# Patient Record
Sex: Male | Born: 1995 | Race: Black or African American | Hispanic: No | Marital: Single | Smoking: Current some day smoker
Health system: Southern US, Community
[De-identification: ages and names within clinical notes are randomized; demographics above are authoritative.]

---
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
## 2020-02-13 ENCOUNTER — Other Ambulatory Visit: Payer: Self-pay

## 2020-02-13 ENCOUNTER — Encounter: Payer: Self-pay | Admitting: Physician Assistant

## 2020-02-13 ENCOUNTER — Ambulatory Visit: Payer: Self-pay | Admitting: Physician Assistant

## 2020-02-13 DIAGNOSIS — Z113 Encounter for screening for infections with a predominantly sexual mode of transmission: Secondary | ICD-10-CM

## 2020-02-13 DIAGNOSIS — Z720 Tobacco use: Secondary | ICD-10-CM | POA: Insufficient documentation

## 2020-02-13 LAB — GRAM STAIN

## 2020-02-13 NOTE — Progress Notes (Signed)
Here today for STD screening. Declines bloodwork. Wendell Fiebig, RN ? ?

## 2020-02-13 NOTE — Progress Notes (Signed)
   [facility] Department STI clinic/screening visit  Subjective:  Joe Nichols is a 24 y.o. male being seen today for an STI screening visit. The patient reports they do not have symptoms.    Patient has the following medical conditions:  There are no problems to display for this patient.    Chief Complaint  Patient presents with  . SEXUALLY TRANSMITTED DISEASE    24 yo man here for STI screening. Asymptomatic today. Did notice some mild tingling at head of penis after voiding several days ago.   Patient reports see HPI.   See flowsheet for further details and programmatic requirements.    The following portions of the patient's history were reviewed and updated as appropriate: allergies, current medications, past medical history, past social history, past surgical history and problem list.  Objective:  There were no vitals filed for this visit.  Physical Exam Constitutional:      Appearance: Normal appearance. He is normal weight.  HENT:     Head: Normocephalic.     Mouth/Throat:     Mouth: Mucous membranes are moist.     Pharynx: Oropharynx is clear. No oropharyngeal exudate or posterior oropharyngeal erythema.  Pulmonary:     Effort: Pulmonary effort is normal.  Abdominal:     General: Abdomen is flat.     Palpations: Abdomen is soft.     Tenderness: There is no abdominal tenderness.  Genitourinary:    Penis: Normal and circumcised. No erythema, tenderness, discharge, swelling or lesions.      Testes: Normal.        Right: Mass, tenderness or swelling not present.        Left: Mass, tenderness or swelling not present.     Epididymis:     Right: Normal.     Left: Normal.  Musculoskeletal:        General: Normal range of motion.     Cervical back: Neck supple.  Lymphadenopathy:     Cervical: No cervical adenopathy.     Lower Body: No right inguinal adenopathy. No left inguinal adenopathy.  Skin:    General: Skin is warm and dry.   Comments: Tattoo mid chest  Neurological:     General: No focal deficit present.     Mental Status: He is alert and oriented to person, place, and time.  Psychiatric:        Mood and Affect: Mood normal.        Behavior: Behavior normal.        Thought Content: Thought content normal.        Judgment: Judgment normal.       Assessment and Plan:  Joe Nichols is a 23 y.o. male presenting to the [facility] Department for STI screening  There are no diagnoses linked to this encounter.    No follow-ups on file.  No future appointments.  Landry Dyke, PA-C

## 2020-02-13 NOTE — Progress Notes (Signed)
Gram Stain results reviewed. No treatment per standing orders indicated. Tawny Hopping, RN

## 2020-02-18 LAB — GONOCOCCUS CULTURE

## 2020-05-22 ENCOUNTER — Ambulatory Visit: Payer: Self-pay | Admitting: Physician Assistant

## 2020-05-22 ENCOUNTER — Other Ambulatory Visit: Payer: Self-pay

## 2020-05-22 ENCOUNTER — Encounter: Payer: Self-pay | Admitting: Physician Assistant

## 2020-05-22 DIAGNOSIS — Z299 Encounter for prophylactic measures, unspecified: Secondary | ICD-10-CM

## 2020-05-22 DIAGNOSIS — Z113 Encounter for screening for infections with a predominantly sexual mode of transmission: Secondary | ICD-10-CM

## 2020-05-22 LAB — GRAM STAIN

## 2020-05-22 MED ORDER — AZITHROMYCIN 500 MG PO TABS
1000.0000 mg | ORAL_TABLET | Freq: Once | ORAL | Status: AC
Start: 2020-05-22 — End: 2020-05-22
  Administered 2020-05-22: 1000 mg via ORAL

## 2020-05-22 NOTE — Progress Notes (Signed)
Pt here for STD screening. 

## 2020-05-22 NOTE — Progress Notes (Signed)
Gram stain reviewed with provider and is negative today. Pt received Azithromycin 1g po DOT per provider order. Counseled pt per provider orders and pt states understanding. Provider orders completed.

## 2020-05-23 NOTE — Progress Notes (Signed)
   [facility] Department STI clinic/screening visit  Subjective:  Joe Nichols is a 24 y.o. male being seen today for an STI screening visit. The patient reports they do have symptoms.    Patient has the following medical conditions:   Patient Active Problem List   Diagnosis Date Noted  . Tobacco abuse 02/13/2020     Chief Complaint  Patient presents with  . SEXUALLY TRANSMITTED DISEASE    screening    HPI  Patient reports that he has had dysuria off and on for 2 months.  Denies other symptoms or any associated symptoms, association with certain foods or drinks.  Denies chronic conditions and surgeries.  Per patient, last HIV test was about 1 year ago.    See flowsheet for further details and programmatic requirements.    The following portions of the patient's history were reviewed and updated as appropriate: allergies, current medications, past medical history, past social history, past surgical history and problem list.  Objective:  There were no vitals filed for this visit.  Physical Exam Constitutional:      General: He is not in acute distress.    Appearance: Normal appearance.  HENT:     Head: Normocephalic and atraumatic.     Comments: No nits, lice or hair loss. No cervical, supraclavicular or axillary adenopathy.    Mouth/Throat:     Mouth: Mucous membranes are moist.     Pharynx: Oropharynx is clear. No oropharyngeal exudate or posterior oropharyngeal erythema.  Eyes:     Conjunctiva/sclera: Conjunctivae normal.  Pulmonary:     Effort: Pulmonary effort is normal.  Abdominal:     Palpations: Abdomen is soft. There is no mass.     Tenderness: There is no abdominal tenderness. There is no guarding or rebound.  Genitourinary:    Penis: Normal.      Testes: Normal.     Comments: Pubic area without nits, lice, edema, erythema, lesions and inguinal adenopathy. Penis without rash, lesions and discharge at meatus. Musculoskeletal:     Cervical  back: Neck supple. No tenderness.  Skin:    General: Skin is warm and dry.     Findings: No bruising, erythema, lesion or rash.  Neurological:     Mental Status: He is alert and oriented to person, place, and time.  Psychiatric:        Mood and Affect: Mood normal.        Behavior: Behavior normal.        Thought Content: Thought content normal.        Judgment: Judgment normal.       Assessment and Plan:  Joe Nichols is a 24 y.o. male presenting to the [facility] Department for STI screening  1. Screening for STD (sexually transmitted disease) Patient into clinic with symptoms.  Declines blood work today. Rec condoms with all sex. Await test results.  Counseled that RN will call if needs to RTC for further treatment once results are back.  - Gram stain - Gonococcus culture  2. Prophylactic measure Will treat with Azithromycin 1 g po DOT today due to patient symptoms. Rec no sex for 7 days and enc to have partner screened. RTC for re-treatment if vomits < 2 hr after taking medicine. - azithromycin (ZITHROMAX) tablet 1,000 mg     No follow-ups on file.  No future appointments.  Matt Holmes, PA

## 2020-05-27 LAB — GONOCOCCUS CULTURE

## 2020-07-04 ENCOUNTER — Emergency Department
Admission: EM | Admit: 2020-07-04 | Discharge: 2020-07-04 | Disposition: A | Discharge status: Home / Self Care | Payer: Self-pay | Attending: Emergency Medicine | Admitting: Emergency Medicine

## 2020-07-04 ENCOUNTER — Other Ambulatory Visit: Payer: Self-pay

## 2020-07-04 ENCOUNTER — Emergency Department: Payer: Self-pay

## 2020-07-04 DIAGNOSIS — Y939 Activity, unspecified: Secondary | ICD-10-CM | POA: Insufficient documentation

## 2020-07-04 DIAGNOSIS — F17210 Nicotine dependence, cigarettes, uncomplicated: Secondary | ICD-10-CM | POA: Insufficient documentation

## 2020-07-04 DIAGNOSIS — Y999 Unspecified external cause status: Secondary | ICD-10-CM | POA: Insufficient documentation

## 2020-07-04 DIAGNOSIS — S61411A Laceration without foreign body of right hand, initial encounter: Secondary | ICD-10-CM | POA: Insufficient documentation

## 2020-07-04 DIAGNOSIS — Y929 Unspecified place or not applicable: Secondary | ICD-10-CM | POA: Insufficient documentation

## 2020-07-04 DIAGNOSIS — W260XXA Contact with knife, initial encounter: Secondary | ICD-10-CM | POA: Insufficient documentation

## 2020-07-04 LAB — BASIC METABOLIC PANEL
Anion gap: 9 (ref 5–15)
BUN: 8 mg/dL (ref 6–20)
CO2: 25 mmol/L (ref 22–32)
Calcium: 9.1 mg/dL (ref 8.9–10.3)
Chloride: 105 mmol/L (ref 98–111)
Creatinine, Ser: 1.09 mg/dL (ref 0.61–1.24)
GFR calc Af Amer: >60 mL/min (ref >60)
GFR calc non Af Amer: >60 mL/min (ref >60)
Glucose, Bld: 99 mg/dL (ref 70–99)
Potassium: 3.8 mmol/L (ref 3.5–5.1)
Sodium: 139 mmol/L (ref 135–145)

## 2020-07-04 LAB — CBC WITH DIFFERENTIAL/PLATELET
Abs Immature Granulocytes: 0.05 10*3/uL (ref 0.00–0.07)
Basophils Absolute: 0.1 10*3/uL (ref 0.0–0.1)
Basophils Relative: 1 %
Eosinophils Absolute: 0.3 10*3/uL (ref 0.0–0.5)
Eosinophils Relative: 2 %
HCT: 44.5 % (ref 39.0–52.0)
Hemoglobin: 16.4 g/dL (ref 13.0–17.0)
Immature Granulocytes: 0 %
Lymphocytes Relative: 40 %
Lymphs Abs: 5 10*3/uL — ABNORMAL HIGH (ref 0.7–4.0)
MCH: 31.1 pg (ref 26.0–34.0)
MCHC: 36.9 g/dL — ABNORMAL HIGH (ref 30.0–36.0)
MCV: 84.3 fL (ref 80.0–100.0)
Monocytes Absolute: 1.1 10*3/uL — ABNORMAL HIGH (ref 0.1–1.0)
Monocytes Relative: 9 %
Neutro Abs: 6.2 10*3/uL (ref 1.7–7.7)
Neutrophils Relative %: 48 %
Platelets: 278 10*3/uL (ref 150–400)
RBC: 5.28 MIL/uL (ref 4.22–5.81)
RDW: 14.1 % (ref 11.5–15.5)
WBC: 12.7 10*3/uL — ABNORMAL HIGH (ref 4.0–10.5)
nRBC: 0 % (ref 0.0–0.2)

## 2020-07-04 MED ORDER — LIDOCAINE-EPINEPHRINE 2 %-1:100000 IJ SOLN
20.0000 mL | Freq: Once | INTRAMUSCULAR | Status: AC
  Administered 2020-07-04: 20 mL
  Filled 2020-07-04: qty 1
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
## 2021-06-11 ENCOUNTER — Other Ambulatory Visit: Payer: Self-pay

## 2021-06-11 ENCOUNTER — Ambulatory Visit: Payer: Medicaid Other | Admitting: Physician Assistant

## 2021-06-11 DIAGNOSIS — Z113 Encounter for screening for infections with a predominantly sexual mode of transmission: Secondary | ICD-10-CM

## 2021-06-11 LAB — GRAM STAIN

## 2021-06-12 ENCOUNTER — Encounter: Payer: Self-pay | Admitting: Physician Assistant

## 2021-06-12 NOTE — Progress Notes (Signed)
   [facility] Department STI clinic/screening visit  Subjective:  Joe Nichols is a 25 y.o. male being seen today for an STI screening visit. The patient reports they do not have symptoms.    Patient has the following medical conditions:   Patient Active Problem List   Diagnosis Date Noted   Tobacco abuse 02/13/2020     Chief Complaint  Patient presents with   SEXUALLY TRANSMITTED DISEASE    screening    HPI  Patient reports that he is not having any symptoms but would like a screening today.  Denies chronic conditions, surgeries and regular medicines.  Reports last HIV test was 2020 and last void prior to sample collection for Gram stain was over 2 hr ago.    See flowsheet for further details and programmatic requirements.    The following portions of the patient's history were reviewed and updated as appropriate: allergies, current medications, past medical history, past social history, past surgical history and problem list.  Objective:  There were no vitals filed for this visit.  Physical Exam Constitutional:      General: He is not in acute distress.    Appearance: Normal appearance.  HENT:     Head: Normocephalic and atraumatic.     Comments: No nits,lice, or hair loss. No cervical, supraclavicular or axillary adenopathy.     Mouth/Throat:     Mouth: Mucous membranes are moist.     Pharynx: Oropharynx is clear. No oropharyngeal exudate or posterior oropharyngeal erythema.  Eyes:     Conjunctiva/sclera: Conjunctivae normal.  Pulmonary:     Effort: Pulmonary effort is normal.  Abdominal:     Palpations: Abdomen is soft. There is no mass.     Tenderness: There is no abdominal tenderness. There is no guarding or rebound.  Genitourinary:    Penis: Normal.      Testes: Normal.     Comments: Pubic area without nits, lice, hair loss, edema, erythema, lesions and inguinal adenopathy. Penis circumcised without rash, lesions and discharge at  meatus. Testicles descended bilaterally,nt, no masses or edema.  Musculoskeletal:     Cervical back: Neck supple. No tenderness.  Skin:    General: Skin is warm and dry.     Findings: No bruising, erythema, lesion or rash.  Neurological:     Mental Status: He is alert and oriented to person, place, and time.  Psychiatric:        Mood and Affect: Mood normal.        Behavior: Behavior normal.        Thought Content: Thought content normal.        Judgment: Judgment normal.      Assessment and Plan:  Joe Nichols is a 25 y.o. male presenting to the [facility] Department for STI screening  1. Screening for STD (sexually transmitted disease) Patient into clinic with symptoms. Patient declines blood work today.  Reviewed Gram stain with patient and that no treatment is indicated today. Rec condoms with all sex. Await test results.  Counseled that RN will call if needs to RTC for treatment once results are back.  - Gram stain - Gonococcus culture     No follow-ups on file.  No future appointments.  Matt Holmes, PA

## 2021-06-16 LAB — GONOCOCCUS CULTURE

## 2021-08-13 ENCOUNTER — Other Ambulatory Visit: Payer: Self-pay

## 2021-08-13 ENCOUNTER — Encounter: Payer: Self-pay | Admitting: Physician Assistant

## 2021-08-13 ENCOUNTER — Ambulatory Visit: Payer: Self-pay | Admitting: Physician Assistant

## 2021-08-13 DIAGNOSIS — Z113 Encounter for screening for infections with a predominantly sexual mode of transmission: Secondary | ICD-10-CM

## 2021-08-13 LAB — GRAM STAIN

## 2021-08-13 NOTE — Progress Notes (Signed)
[facility] Department STI clinic/screening visit  Subjective:  Joe Nichols is a 25 y.o. male being seen today for an STI screening visit. The patient reports they do not have symptoms.    Patient has the following medical conditions:   Patient Active Problem List   Diagnosis Date Noted   Tobacco abuse 02/13/2020     Chief Complaint  Patient presents with   SEXUALLY TRANSMITTED DISEASE    screening    HPI  Patient reports that he is not having symptoms but would like a screening. Denies chronic conditions, surgeries and regular medicines.  Reports last HIV test was about 1 year ago and last void prior to sample collection for Gram stain was about 2 hr ago.     Screening for MPX risk: Does the patient have an unexplained rash? No Is the patient MSM? No Does the patient endorse multiple sex partners or anonymous sex partners? No Did the patient have close or sexual contact with a person diagnosed with MPX? No Has the patient traveled outside the [place] where MPX is endemic? No Is there a high clinical suspicion for MPX-- evidenced by one of the following No  -Unlikely to be chickenpox  -Lymphadenopathy  -Rash that present in same phase of evolution on any given body part   See flowsheet for further details and programmatic requirements.    The following portions of the patient's history were reviewed and updated as appropriate: allergies, current medications, past medical history, past social history, past surgical history and problem list.  Objective:  There were no vitals filed for this visit.  Physical Exam Constitutional:      General: He is not in acute distress.    Appearance: Normal appearance.  HENT:     Head: Normocephalic and atraumatic.     Comments: No nits,lice, or hair loss. No cervical, supraclavicular or axillary adenopathy.     Mouth/Throat:     Mouth: Mucous membranes are moist.     Pharynx: Oropharynx is clear. No oropharyngeal  exudate or posterior oropharyngeal erythema.  Eyes:     Conjunctiva/sclera: Conjunctivae normal.  Pulmonary:     Effort: Pulmonary effort is normal.  Abdominal:     Palpations: Abdomen is soft. There is no mass.     Tenderness: There is no abdominal tenderness. There is no guarding or rebound.  Genitourinary:    Penis: Normal.      Testes: Normal.     Comments: Pubic area without nits, lice, hair loss, edema, erythema, lesions and inguinal adenopathy. Penis circumcised without rash, lesions and discharge at meatus. Testicles descended bilaterally,nt, no masses or edema.  Musculoskeletal:     Cervical back: Neck supple. No tenderness.  Skin:    General: Skin is warm and dry.     Findings: No bruising, erythema, lesion or rash.  Neurological:     Mental Status: He is alert and oriented to person, place, and time.  Psychiatric:        Mood and Affect: Mood normal.        Behavior: Behavior normal.        Thought Content: Thought content normal.        Judgment: Judgment normal.      Assessment and Plan:  Joe Nichols is a 25 y.o. male presenting to the [facility] Department for STI screening  1. Screening for STD (sexually transmitted disease) Patient into clinic without symptoms. Patient declines blood work today. Reviewed with patient Gram  stain results and that no treatment is indicated today. Rec condoms with all sex. Await test results.  Counseled that RN will call if needs to RTC for treatment once results are back.  - Gram stain - Gonococcus culture     No follow-ups on file.  No future appointments.  Matt Holmes, PA

## 2021-08-17 LAB — GONOCOCCUS CULTURE

## 2022-02-22 ENCOUNTER — Emergency Department
Admission: EM | Admit: 2022-02-22 | Discharge: 2022-02-22 | Disposition: A | Discharge status: Home / Self Care | Payer: Self-pay | Attending: Emergency Medicine | Admitting: Emergency Medicine

## 2022-02-22 ENCOUNTER — Other Ambulatory Visit: Payer: Self-pay

## 2022-02-22 ENCOUNTER — Encounter: Payer: Self-pay | Admitting: Emergency Medicine

## 2022-02-22 ENCOUNTER — Emergency Department: Payer: Self-pay

## 2022-02-22 DIAGNOSIS — S01112A Laceration without foreign body of left eyelid and periocular area, initial encounter: Secondary | ICD-10-CM | POA: Insufficient documentation

## 2022-02-22 DIAGNOSIS — S0083XA Contusion of other part of head, initial encounter: Secondary | ICD-10-CM

## 2022-02-22 DIAGNOSIS — S0512XA Contusion of eyeball and orbital tissues, left eye, initial encounter: Secondary | ICD-10-CM

## 2022-02-22 DIAGNOSIS — Z23 Encounter for immunization: Secondary | ICD-10-CM | POA: Insufficient documentation

## 2022-02-22 DIAGNOSIS — S0181XA Laceration without foreign body of other part of head, initial encounter: Secondary | ICD-10-CM

## 2022-02-22 MED ORDER — TETANUS-DIPHTH-ACELL PERTUSSIS 5-2.5-18.5 LF-MCG/0.5 IM SUSY
0.5000 mL | PREFILLED_SYRINGE | Freq: Once | INTRAMUSCULAR | Status: AC
  Administered 2022-02-22: 0.5 mL via INTRAMUSCULAR
  Filled 2022-02-22: qty 0.5

## 2022-02-22 MED ORDER — FLUORESCEIN SODIUM 1 MG OP STRP
1.0000 | ORAL_STRIP | Freq: Once | OPHTHALMIC | Status: AC
  Administered 2022-02-22: 1 via OPHTHALMIC
  Filled 2022-02-22: qty 1

## 2022-02-22 MED ORDER — CEPHALEXIN 500 MG PO CAPS: 500.0000 mg | ORAL_CAPSULE | Freq: Three times a day (TID) | ORAL | 0 refills | Status: DC

## 2022-02-22 MED ORDER — TETRACAINE HCL 0.5 % OP SOLN
2.0000 [drp] | Freq: Once | OPHTHALMIC | Status: AC
  Administered 2022-02-22: 2 [drp] via OPHTHALMIC
  Filled 2022-02-22: qty 4
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
## 2022-02-22 NOTE — ED Notes (Signed)
Pt provided discharge instructions and prescription information. Pt was given the opportunity to ask questions and questions were answered. Discharge signature not obtained in the setting of the COVID-19 pandemic in order to reduce high touch surfaces.  ° °
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
## 2022-02-22 NOTE — Discharge Instructions (Addendum)
Follow-up with [facility] for any visual changes or eye pain.  Return emergency department worsening.  Use the antibiotic as prescribed. ?Tylenol or ibuprofen for pain as needed ?
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
## 2022-06-28 ENCOUNTER — Ambulatory Visit: Payer: Self-pay | Admitting: Family Medicine

## 2022-06-28 ENCOUNTER — Encounter: Payer: Self-pay | Admitting: Family Medicine

## 2022-06-28 DIAGNOSIS — N341 Nonspecific urethritis: Secondary | ICD-10-CM

## 2022-06-28 DIAGNOSIS — Z113 Encounter for screening for infections with a predominantly sexual mode of transmission: Secondary | ICD-10-CM

## 2022-06-28 LAB — GRAM STAIN

## 2022-06-28 MED ORDER — DOXYCYCLINE HYCLATE 100 MG PO TABS: 100.0000 mg | ORAL_TABLET | Freq: Two times a day (BID) | ORAL | 0 refills | Status: AC

## 2022-06-28 NOTE — Progress Notes (Signed)
[facility] Department STI clinic/screening visit  Subjective:  Joe Nichols is a 26 y.o. male being seen today for an STI screening visit. The patient reports they do have symptoms.    Patient has the following medical conditions:   Patient Active Problem List   Diagnosis Date Noted   Tobacco abuse 02/13/2020     Chief Complaint  Patient presents with   SEXUALLY TRANSMITTED DISEASE    Screening burning when urinating     HPI  Patient reports he has noted dysuria x 1 week, no other symptoms.  States that he has had 3 partners x 2-3 months.  He states that partners deny symptoms or STD treatment.   Does the patient or their partner desires a pregnancy in the next year? No  Screening for MPX risk: Does the patient have an unexplained rash? No Is the patient MSM? No Does the patient endorse multiple sex partners or anonymous sex partners? No Did the patient have close or sexual contact with a person diagnosed with MPX? No Has the patient traveled outside the [place] where MPX is endemic? No Is there a high clinical suspicion for MPX-- evidenced by one of the following No  -Unlikely to be chickenpox  -Lymphadenopathy  -Rash that present in same phase of evolution on any given body part   See flowsheet for further details and programmatic requirements.   Immunization History  Administered Date(s) Administered   Tdap 02/22/2022     The following portions of the patient's history were reviewed and updated as appropriate: allergies, current medications, past medical history, past social history, past surgical history and problem list.  Objective:  There were no vitals filed for this visit.  Physical Exam Constitutional:      Appearance: Normal appearance.  HENT:     Head: Normocephalic and atraumatic.     Comments: No nits or hair loss    Mouth/Throat:     Mouth: Mucous membranes are moist.     Pharynx: Oropharynx is clear. No oropharyngeal exudate or  posterior oropharyngeal erythema.  Pulmonary:     Effort: Pulmonary effort is normal.  Abdominal:     General: Abdomen is flat.     Palpations: Abdomen is soft. There is no hepatomegaly or mass.     Tenderness: There is no abdominal tenderness.  Genitourinary:    Pubic Area: No rash or pubic lice (no nits).      Penis: Circumcised. Discharge present. No tenderness, swelling or lesions.      Testes: Normal.     Epididymis:     Right: Normal.     Left: Normal.     Comments: Penis- small amt of clear discharge. Lymphadenopathy:     Head:     Right side of head: No preauricular or posterior auricular adenopathy.     Left side of head: No preauricular or posterior auricular adenopathy.     Cervical: No cervical adenopathy.     Upper Body:     Right upper body: No supraclavicular, axillary or epitrochlear adenopathy.     Left upper body: No supraclavicular, axillary or epitrochlear adenopathy.     Lower Body: No right inguinal adenopathy. No left inguinal adenopathy.  Skin:    General: Skin is warm and dry.     Findings: No rash.  Neurological:     Mental Status: He is alert and oriented to person, place, and time.       Assessment and Plan:  Joe Nichols is  a 26 y.o. male presenting to the [facility] Department for STI screening   1. Screening examination for venereal disease  - Gram stain - Gonococcus culture - Chlamydia/GC NAA, Confirmation  2. Non-gonococcal urethritis (NGU) - doxycycline (VIBRA-TABS) 100 MG tablet; Take 1 tablet (100 mg total) by mouth 2 (two) times daily for 7 days.  Dispense: 14 tablet; Refill: 0  Co. To abstain from sexual activity x 1 week, use condoms with all sexual activity and to notify partners for them to have evaluation and treatment for contact to NGU.  RTC if symptoms persist after completing treatment.  Return if symptoms worsen or fail to improve.  No future appointments.  Joe Pickett, FNP

## 2022-06-28 NOTE — Progress Notes (Signed)
Pt here for STD screening.  Gram stain results reviewed.  The patient was dispensed Doxycycline 100 mg today. I provided counseling today regarding the medication. We discussed the medication, the side effects and when to call clinic. Patient given the opportunity to ask questions. Questions answered. Berdie Ogren, RN

## 2022-06-30 LAB — CHLAMYDIA/GC NAA, CONFIRMATION
Chlamydia trachomatis, NAA: NEGATIVE
Neisseria gonorrhoeae, NAA: NEGATIVE

## 2022-07-02 LAB — GONOCOCCUS CULTURE

## 2022-07-12 ENCOUNTER — Encounter: Payer: Self-pay | Admitting: Emergency Medicine

## 2022-07-12 ENCOUNTER — Emergency Department
Admission: EM | Admit: 2022-07-12 | Discharge: 2022-07-12 | Disposition: A | Discharge status: Home / Self Care | Payer: Medicaid Other | Attending: Emergency Medicine | Admitting: Emergency Medicine

## 2022-07-12 ENCOUNTER — Other Ambulatory Visit: Payer: Self-pay

## 2022-07-12 DIAGNOSIS — A64 Unspecified sexually transmitted disease: Secondary | ICD-10-CM | POA: Insufficient documentation

## 2022-07-12 DIAGNOSIS — Z202 Contact with and (suspected) exposure to infections with a predominantly sexual mode of transmission: Secondary | ICD-10-CM | POA: Diagnosis not present

## 2022-07-12 LAB — CHLAMYDIA/NGC RT PCR (ARMC ONLY)
Chlamydia Tr: NOT DETECTED
N gonorrhoeae: NOT DETECTED

## 2022-07-12 LAB — URINALYSIS, ROUTINE W REFLEX MICROSCOPIC
Bilirubin Urine: NEGATIVE
Glucose, UA: NEGATIVE mg/dL
Hgb urine dipstick: NEGATIVE
Ketones, ur: NEGATIVE mg/dL
Nitrite: NEGATIVE
Protein, ur: NEGATIVE mg/dL
Specific Gravity, Urine: 1.026 (ref 1.005–1.030)
pH: 5 (ref 5.0–8.0)

## 2022-07-12 MED ORDER — CEFTRIAXONE SODIUM 250 MG IJ SOLR
250.0000 mg | Freq: Once | INTRAMUSCULAR | Status: AC
  Administered 2022-07-12: 250 mg via INTRAMUSCULAR
  Filled 2022-07-12: qty 250

## 2022-07-12 MED ORDER — METRONIDAZOLE 500 MG PO TABS: 500.0000 mg | ORAL_TABLET | Freq: Two times a day (BID) | ORAL | 0 refills | Status: AC

## 2022-07-12 MED ORDER — LIDOCAINE HCL (PF) 1 % IJ SOLN
5.0000 mL | Freq: Once | INTRAMUSCULAR | Status: AC
  Administered 2022-07-12: 5 mL
  Filled 2022-07-12: qty 5

## 2022-07-12 MED ORDER — AZITHROMYCIN 500 MG PO TABS
2000.0000 mg | ORAL_TABLET | Freq: Once | ORAL | Status: AC
  Administered 2022-07-12: 2000 mg via ORAL
  Filled 2022-07-12: qty 4

## 2022-07-12 NOTE — ED Provider Notes (Signed)
[facility] Provider Note    Event Date/Time   First MD Initiated Contact with Patient 07/12/22 1353     (approximate)   History   SEXUALLY TRANSMITTED DISEASE   HPI  Joe Nichols is a 26 y.o. male who is requesting evaluation after his sexual partner tested positive for trichomoniasis.  He has had some burning discomfort has not noted any discharge.  Denies any flank pain no fevers.     Physical Exam   Triage Vital Signs: ED Triage Vitals  Enc Vitals Group     BP 07/12/22 1314 129/82     Pulse Rate 07/12/22 1314 90     Resp 07/12/22 1314 16     Temp 07/12/22 1314 98.5 F (36.9 C)     Temp Source 07/12/22 1314 Oral     SpO2 07/12/22 1314 99 %     Weight 07/12/22 1315 160 lb (72.6 kg)     Height 07/12/22 1315 5\' 11"  (1.803 m)     Head Circumference --      Peak Flow --      Pain Score 07/12/22 1315 10     Pain Loc --      Pain Edu? --      Excl. in GC? --     Most recent vital signs: Vitals:   07/12/22 1314  BP: 129/82  Pulse: 90  Resp: 16  Temp: 98.5 F (36.9 C)  SpO2: 99%     Constitutional: Alert  Eyes: Conjunctivae are normal.  Head: Atraumatic. Nose: No congestion/rhinnorhea. Mouth/Throat: Mucous membranes are moist.   Neck: Painless ROM.  Cardiovascular:   Good peripheral circulation. Respiratory: Normal respiratory effort.  No retractions.  Gastrointestinal: Soft and nontender.  Musculoskeletal:  no deformity Neurologic:  MAE spontaneously. No gross focal neurologic deficits are appreciated.  Skin:  Skin is warm, dry and intact. No rash noted. Psychiatric: Mood and affect are normal. Speech and behavior are normal.    ED Results / Procedures / Treatments   Labs (all labs ordered are listed, but only abnormal results are displayed) Labs Reviewed  URINALYSIS, ROUTINE W REFLEX MICROSCOPIC - Abnormal; Notable for the following components:      Result Value   Color, Urine YELLOW (*)    APPearance HAZY (*)     Leukocytes,Ua SMALL (*)    Bacteria, UA FEW (*)    All other components within normal limits  CHLAMYDIA/NGC RT PCR [facility] ONLY)                PROCEDURES:  Critical Care performed:   Procedures   MEDICATIONS ORDERED IN ED: Medications  cefTRIAXone (ROCEPHIN) injection 250 mg (250 mg Intramuscular Given 07/12/22 1444)  azithromycin (ZITHROMAX) tablet 2,000 mg (2,000 mg Oral Given 07/12/22 1444)  lidocaine (PF) (XYLOCAINE) 1 % injection 5 mL (5 mLs Other Given 07/12/22 1444)     IMPRESSION / MDM / ASSESSMENT AND PLAN / ED COURSE  I reviewed the triage vital signs and the nursing notes.                              Differential diagnosis includes, but is not limited to, STI, HIV, trichomoniasis, urethritis, UTI  Patient presented to the ER for evaluation as described above.  Patient is afebrile hemodynamically stable.  He is declining genital exam.  Just wants to be tit tested and treated.  We will treat empirically given symptoms.  He  is declining HIV testing.  Patient without any allergies.  Patient appropriate for outpatient follow-up.      Patient's presentation is most consistent with    FINAL CLINICAL IMPRESSION(S) / ED DIAGNOSES   Final diagnoses:  STD exposure     Rx / DC Orders   ED Discharge Orders          Ordered    metroNIDAZOLE (FLAGYL) 500 MG tablet  2 times daily        07/12/22 1437             Note:  This document was prepared using Dragon voice recognition software and may include unintentional dictation errors.    Willy Eddy, MD 07/12/22 [phone]

## 2022-07-12 NOTE — ED Triage Notes (Signed)
Pt here for a STI. Pt states his partner tested positive for trichomonas. Pt having pain when urinating, denies discharge or bleeding.

## 2022-07-12 NOTE — ED Provider Triage Note (Signed)
Emergency Medicine Provider Triage Evaluation Note  Joe Nichols , a 26 y.o. male  was evaluated in triage.  Pt complains of STI. His partner tested positive for trich and either gonorrhea or chlamydia. He is having dysuria. No fever.  Physical Exam  BP 129/82 (BP Location: Left Arm)   Pulse 90   Temp 98.5 F (36.9 C) (Oral)   Resp 16   Ht 5\' 11"  (1.803 m)   Wt 72.6 kg   SpO2 99%   BMI 22.32 kg/m  Gen:   Awake, no distress   Resp:  Normal effort  MSK:   Moves extremities without difficulty  Other:    Medical Decision Making  Medically screening exam initiated at 1:18 PM.  Appropriate orders placed.  Joe Nichols was informed that the remainder of the evaluation will be completed by another provider, this initial triage assessment does not replace that evaluation, and the importance of remaining in the ED until their evaluation is complete.    , FNP 07/12/22 1319

## 2022-07-21 ENCOUNTER — Ambulatory Visit: Payer: Medicaid Other

## 2022-07-22 NOTE — Progress Notes (Unsigned)
Patient not seen.  No show. Glenna Fellows, FNP

## 2022-11-21 ENCOUNTER — Other Ambulatory Visit: Payer: Self-pay

## 2022-11-21 ENCOUNTER — Emergency Department
Admission: EM | Admit: 2022-11-21 | Discharge: 2022-11-21 | Disposition: A | Discharge status: Home / Self Care | Payer: Medicaid Other | Attending: Emergency Medicine | Admitting: Emergency Medicine

## 2022-11-21 DIAGNOSIS — Z202 Contact with and (suspected) exposure to infections with a predominantly sexual mode of transmission: Secondary | ICD-10-CM | POA: Diagnosis not present

## 2022-11-21 DIAGNOSIS — R300 Dysuria: Secondary | ICD-10-CM | POA: Insufficient documentation

## 2022-11-21 LAB — URINALYSIS, ROUTINE W REFLEX MICROSCOPIC
Bacteria, UA: NONE SEEN
Bilirubin Urine: NEGATIVE
Glucose, UA: NEGATIVE mg/dL
Hgb urine dipstick: NEGATIVE
Ketones, ur: NEGATIVE mg/dL
Nitrite: NEGATIVE
Protein, ur: NEGATIVE mg/dL
Specific Gravity, Urine: 1.021 (ref 1.005–1.030)
WBC, UA: >50 WBC/hpf — ABNORMAL HIGH (ref 0–5)
pH: 6 (ref 5.0–8.0)

## 2022-11-21 LAB — CHLAMYDIA/NGC RT PCR (ARMC ONLY)
Chlamydia Tr: NOT DETECTED
N gonorrhoeae: NOT DETECTED

## 2022-11-21 MED ORDER — DOXYCYCLINE MONOHYDRATE 100 MG PO TABS: 100.0000 mg | ORAL_TABLET | Freq: Two times a day (BID) | ORAL | 0 refills | Status: AC

## 2022-11-21 MED ORDER — CEFTRIAXONE SODIUM 1 G IJ SOLR
500.0000 mg | Freq: Once | INTRAMUSCULAR | Status: AC
Start: 2022-11-21 — End: 2022-11-21
  Administered 2022-11-21: 500 mg via INTRAMUSCULAR
  Filled 2022-11-21: qty 5

## 2022-11-21 NOTE — ED Triage Notes (Signed)
Arrives for a 'check up'.  States he has had unprotected sex and want to get checked for STD. Denies symptoms.

## 2022-11-21 NOTE — ED Provider Triage Note (Signed)
Emergency Medicine Provider Triage Evaluation Note  Joe Nichols , a 26 y.o. male  was evaluated in triage.  Pt complains of possible STD. Today having burning with urination.  Unprotected sex.   Review of Systems  Positive: Dysuria. Negative: No penile discharge  Physical Exam  BP 111/85 (BP Location: Left Arm)   Pulse 94   Temp 98.2 F (36.8 C) (Oral)   Resp 18   Ht 5\' 11"  (1.803 m)   Wt 72.6 kg   SpO2 99%   BMI 22.32 kg/m  Gen:   Awake, no distress   Resp:  Normal effort  MSK:   Moves extremities without difficulty  Other:    Medical Decision Making  Medically screening exam initiated at 8:32 AM.  Appropriate orders placed.  Joe Nichols was informed that the remainder of the evaluation will be completed by another provider, this initial triage assessment does not replace that evaluation, and the importance of remaining in the ED until their evaluation is complete.     , PA-C 11/21/22 [phone]

## 2022-11-21 NOTE — ED Notes (Signed)
Says dysuria since this am.  No sores.

## 2022-11-21 NOTE — ED Provider Notes (Signed)
   [facility] Provider Note    Event Date/Time   First MD Initiated Contact with Patient 11/21/22 [phone]     (approximate)   History   Exposure to STD   HPI  Joe Nichols is a 26 y.o. male  otherwise healthy, who comes in with burning sensation. Noticed today. Some new partners worried about STD.   NO swelling or pain in testicles.   Physical Exam   Triage Vital Signs: ED Triage Vitals  Enc Vitals Group     BP 11/21/22 0831 111/85     Pulse Rate 11/21/22 0831 94     Resp 11/21/22 0831 18     Temp 11/21/22 0831 98.2 F (36.8 C)     Temp Source 11/21/22 0831 Oral     SpO2 11/21/22 0831 99 %     Weight 11/21/22 0817 160 lb 0.9 oz (72.6 kg)     Height 11/21/22 0817 5\' 11"  (1.803 m)     Head Circumference --      Peak Flow --      Pain Score 11/21/22 0817 0     Pain Loc --      Pain Edu? --      Excl. in GC? --     Most recent vital signs: Vitals:   11/21/22 0831  BP: 111/85  Pulse: 94  Resp: 18  Temp: 98.2 F (36.8 C)  SpO2: 99%     General: Awake, no distress.  CV:  Good peripheral perfusion.  Resp:  Normal effort.  Abd:  No distention.  Other:  Gu exam normal- no testicle pain or redness    ED Results / Procedures / Treatments   Labs (all labs ordered are listed, but only abnormal results are displayed) Labs Reviewed  URINALYSIS, ROUTINE W REFLEX MICROSCOPIC - Abnormal; Notable for the following components:      Result Value   Color, Urine YELLOW (*)    APPearance HAZY (*)    Leukocytes,Ua SMALL (*)    WBC, UA >50 (*)    All other components within normal limits  CHLAMYDIA/NGC RT PCR ([facility] ONLY)               PROCEDURES:  Critical Care performed: No  Procedures   MEDICATIONS ORDERED IN ED: Medications  cefTRIAXone (ROCEPHIN) injection 500 mg (has no administration in time range)     IMPRESSION / MDM / ASSESSMENT AND PLAN / ED COURSE  I reviewed the triage vital signs and the nursing notes.   Patient's  presentation is most consistent with acute, uncomplicated illness.   Concerning for STD/UTI.  UA with WBC suspect STD will send for Culture to ensure no UTI. Will call if need antibiotics but will treatment wit STD to start. No epidyitismis on exam. Will f/u health department for HIV/syphillis testing.       FINAL CLINICAL IMPRESSION(S) / ED DIAGNOSES   Final diagnoses:  STD exposure     Rx / DC Orders   ED Discharge Orders          Ordered    doxycycline (ADOXA) 100 MG tablet  2 times daily        11/21/22 14/04/23             Note:  This document was prepared using Dragon voice recognition software and may include unintentional dictation errors.   [id number], MD 11/21/22 [phone]

## 2022-11-21 NOTE — Discharge Instructions (Addendum)
Syphilis and HIV testing at health department Treating you for the other STDS. Results pending. Follow in mychart  No sex 7 days.  Tell partners so they can get treatment.

## 2022-11-22 LAB — URINE CULTURE: Culture: NO GROWTH

## 2023-01-23 ENCOUNTER — Encounter: Payer: Self-pay | Admitting: Emergency Medicine

## 2023-01-23 ENCOUNTER — Emergency Department
Admission: EM | Admit: 2023-01-23 | Discharge: 2023-01-23 | Disposition: A | Discharge status: Home / Self Care | Payer: Medicaid Other | Attending: Emergency Medicine | Admitting: Emergency Medicine

## 2023-01-23 ENCOUNTER — Other Ambulatory Visit: Payer: Self-pay

## 2023-01-23 DIAGNOSIS — B9689 Other specified bacterial agents as the cause of diseases classified elsewhere: Secondary | ICD-10-CM | POA: Diagnosis not present

## 2023-01-23 DIAGNOSIS — H1033 Unspecified acute conjunctivitis, bilateral: Secondary | ICD-10-CM | POA: Diagnosis not present

## 2023-01-23 DIAGNOSIS — H1030 Unspecified acute conjunctivitis, unspecified eye: Secondary | ICD-10-CM

## 2023-01-23 MED ORDER — SULFACETAMIDE SODIUM 10 % OP SOLN: 2.0000 [drp] | Freq: Four times a day (QID) | OPHTHALMIC | 0 refills | Status: AC

## 2023-01-23 NOTE — Discharge Instructions (Signed)
Follow-up with your regular doctor if not improving 3 days.  Return emergency department worsening.  Use medication as prescribed.  Apply warm wet compress if your eyelids are matted and you need to remove the crusting

## 2023-01-23 NOTE — ED Provider Notes (Signed)
[facility] Provider Note    Event Date/Time   First MD Initiated Contact with Patient 01/23/23 1139     (approximate)   History   Eye Problem   HPI  Joe Nichols is a 27 y.o. male with no significant past medical history presents emergency department with bilateral eye redness for 3 days.  States been red and tearing.  Some crusting and matting.  No known injury.  No known exposure      Physical Exam   Triage Vital Signs: ED Triage Vitals  Enc Vitals Group     BP 01/23/23 1138 127/78     Pulse Rate 01/23/23 1138 78     Resp 01/23/23 1138 18     Temp 01/23/23 1138 98.6 F (37 C)     Temp Source 01/23/23 1138 Oral     SpO2 01/23/23 1138 98 %     Weight 01/23/23 1128 160 lb (72.6 kg)     Height 01/23/23 1128 5\' 11"  (1.803 m)     Head Circumference --      Peak Flow --      Pain Score 01/23/23 1128 0     Pain Loc --      Pain Edu? --      Excl. in [place]? --     Most recent vital signs: Vitals:   01/23/23 1138  BP: 127/78  Pulse: 78  Resp: 18  Temp: 98.6 F (37 C)  SpO2: 98%     General: Awake, no distress.   CV:  Good peripheral perfusion. regular rate and  rhythm Resp:  Normal effort.  Abd:  No distention.   Other:  PERRL, EOMI, conjunctiva are grossly injected bilaterally, no active drainage noted, dried crusting noted along the lash line   ED Results / Procedures / Treatments   Labs (all labs ordered are listed, but only abnormal results are displayed) Labs Reviewed - No data to display   EKG     RADIOLOGY     PROCEDURES:   Procedures   MEDICATIONS ORDERED IN ED: Medications - No data to display   IMPRESSION / MDM / [place] / ED COURSE  I reviewed the triage vital signs and the nursing notes.                              Differential diagnosis includes, but is not limited to, acute viral conjunctivitis, chemical conjunctivitis, allergic conjunctivitis, bacterial conjunctivitis,  dacryocystitis  Patient's presentation is most consistent with acute, uncomplicated illness.   Patient's exam is consistent with a bacterial conjunctivitis.  Patient be placed on Bleph-10 ophthalmic drops.  He is to follow-up with his regular doctor if not improving 3 days.  Return if worsening.  Use medication as prescribed.  Apply warm compresses as needed.  He was offered a work note but states he does not need 1.  He is discharged stable condition.      FINAL CLINICAL IMPRESSION(S) / ED DIAGNOSES   Final diagnoses:  Acute bacterial conjunctivitis, unspecified laterality     Rx / DC Orders   ED Discharge Orders          Ordered    sulfacetamide (BLEPH-10) 10 % ophthalmic solution  4 times daily        01/23/23 1149             Note:  This document was prepared using Dragon voice recognition  software and may include unintentional dictation errors.    Versie Starks, PA-C 01/23/23 1152    Harvest Dark, MD 01/23/23 1356

## 2023-01-23 NOTE — ED Triage Notes (Signed)
C/o bilateral eye redness and tearing x 3 days.  Denies visual changes/ difficulties

## 2023-12-20 DIAGNOSIS — Z419 Encounter for procedure for purposes other than remedying health state, unspecified: Secondary | ICD-10-CM | POA: Diagnosis not present

## 2024-01-20 DIAGNOSIS — Z419 Encounter for procedure for purposes other than remedying health state, unspecified: Secondary | ICD-10-CM | POA: Diagnosis not present

## 2024-02-17 DIAGNOSIS — Z419 Encounter for procedure for purposes other than remedying health state, unspecified: Secondary | ICD-10-CM | POA: Diagnosis not present

## 2024-03-30 DIAGNOSIS — Z419 Encounter for procedure for purposes other than remedying health state, unspecified: Secondary | ICD-10-CM | POA: Diagnosis not present

## 2024-04-29 DIAGNOSIS — Z419 Encounter for procedure for purposes other than remedying health state, unspecified: Secondary | ICD-10-CM | POA: Diagnosis not present

## 2024-05-10 ENCOUNTER — Ambulatory Visit
Admission: RE | Admit: 2024-05-10 | Discharge: 2024-05-10 | Disposition: A | Discharge status: Home / Self Care | Source: Ambulatory Visit | Attending: Physician Assistant | Admitting: Physician Assistant

## 2024-05-10 VITALS — BP 124/74 | HR 72 | Temp 99.0°F | Resp 15 | Ht 71.0 in | Wt 160.1 lb

## 2024-05-10 DIAGNOSIS — Z113 Encounter for screening for infections with a predominantly sexual mode of transmission: Secondary | ICD-10-CM | POA: Insufficient documentation

## 2024-05-10 DIAGNOSIS — Z711 Person with feared health complaint in whom no diagnosis is made: Secondary | ICD-10-CM | POA: Diagnosis not present

## 2024-05-10 NOTE — ED Provider Notes (Signed)
 [facility]    CSN: [id number] Arrival date & time: 05/10/24  1205      History   Chief Complaint Chief Complaint  Patient presents with   Exposure to STD    Appointment    HPI Joe Nichols is a 28 y.o. male.   28 year old male patient, Joe Nichols, presents to urgent care for STI evaluation.  Patient states he does not have any GU symptoms however his girlfriend wants him to get tested for STI and treated for BV.   The history is provided by the patient and a significant other. No language interpreter was used.    History reviewed. No pertinent past medical history.  Patient Active Problem List   Diagnosis Date Noted   Screen for STD (sexually transmitted disease) 05/10/2024   Concern about STD in male without diagnosis 05/10/2024   Tobacco abuse 02/13/2020    History reviewed. No pertinent surgical history.     Home Medications    Prior to Admission medications   Not on File    Family History History reviewed. No pertinent family history.  Social History Social History   Tobacco Use   Smoking status: Some Days    Types: Cigarettes    Passive exposure: Never   Smokeless tobacco: Never  Vaping Use   Vaping status: Never Used  Substance Use Topics   Alcohol use: Yes    Comment: occas.   Drug use: Yes    Types: Marijuana     Allergies   Patient has no known allergies.   Review of Systems Review of Systems  Constitutional:  Negative for fever.  Genitourinary:  Negative for dysuria, genital sores, penile discharge, penile pain, penile swelling, scrotal swelling, testicular pain and urgency.  All other systems reviewed and are negative.    Physical Exam Triage Vital Signs ED Triage Vitals  Encounter Vitals Group     BP 05/10/24 1219 124/74     Systolic BP Percentile --      Diastolic BP Percentile --      Pulse Rate 05/10/24 1219 72     Resp 05/10/24 1219 15     Temp 05/10/24 1219 99 F (37.2 C)     Temp Source  05/10/24 1219 Oral     SpO2 05/10/24 1219 97 %     Weight 05/10/24 1218 160 lb 0.9 oz (72.6 kg)     Height 05/10/24 1218 5\' 11"  (1.803 m)     Head Circumference --      Peak Flow --      Pain Score 05/10/24 1218 0     Pain Loc --      Pain Education --      Exclude from Growth Chart --    No data found.  Updated Vital Signs BP 124/74 (BP Location: Right Arm)   Pulse 72   Temp 99 F (37.2 C) (Oral)   Resp 15   Ht 5\' 11"  (1.803 m)   Wt 160 lb 0.9 oz (72.6 kg)   SpO2 97%   BMI 22.32 kg/m   Visual Acuity Right Eye Distance:   Left Eye Distance:   Bilateral Distance:    Right Eye Near:   Left Eye Near:    Bilateral Near:     Physical Exam Vitals and nursing note reviewed. Exam conducted with a chaperone present.  Constitutional:      Appearance: Normal appearance. He is well-developed and well-groomed.  HENT:     Head: Normocephalic.  Cardiovascular:     Rate and Rhythm: Normal rate.  Pulmonary:     Effort: Pulmonary effort is normal.  Genitourinary:    Penis: Normal. No erythema, tenderness, discharge, swelling or lesions.      Testes: Normal.     Epididymis:     Right: Normal.     Left: Normal.  Neurological:     General: No focal deficit present.     Mental Status: He is alert and oriented to person, place, and time.     GCS: GCS eye subscore is 4. GCS verbal subscore is 5. GCS motor subscore is 6.  Psychiatric:        Attention and Perception: Attention normal.        Mood and Affect: Mood normal.        Speech: Speech normal.        Behavior: Behavior normal. Behavior is cooperative.      UC Treatments / Results  Labs (all labs ordered are listed, but only abnormal results are displayed) Labs Reviewed  CYTOLOGY, (ORAL, ANAL, URETHRAL) ANCILLARY ONLY    EKG   Radiology No results found.  Procedures Procedures (including critical care time)  Medications Ordered in UC Medications - No data to display  Initial Impression / Assessment and  Plan / UC Course  I have reviewed the triage vital signs and the nursing notes.  Pertinent labs & imaging results that were available during my care of the patient were reviewed by me and considered in my medical decision making (see chart for details).    Discussed exam findings and plan of care with patient and significant other, check MyChart for results, safe sex precaution given, strict go to ER precautions given, patient verbalized understanding this provider.  Ddx: Routine screening for STI Final Clinical Impressions(s) / UC Diagnoses   Final diagnoses:  Concern about STD in male without diagnosis  Screen for STD (sexually transmitted disease)     Discharge Instructions      Check my chart for results. Avoid sexual activity until results,treatment known and completed. Safe sex with all future sexual activity. We have sent testing for sexually transmitted infections. We will notify you of any positive results once they are received. If required, we will prescribe any medications you might need.  Follow up with PCP. Return as needed.  Males do not get BV, make sure to perform good hygiene before and after sexual activity  ED Prescriptions   None    PDMP not reviewed this encounter.   Peter Brands, NP 05/10/24 1807

## 2024-05-10 NOTE — Discharge Instructions (Addendum)
 Check my chart for results. Avoid sexual activity until results,treatment known and completed. Safe sex with all future sexual activity. We have sent testing for sexually transmitted infections. We will notify you of any positive results once they are received. If required, we will prescribe any medications you might need.  Follow up with PCP. Return as needed.  Males do not get BV, make sure to perform good hygiene before and after sexual activity

## 2024-05-10 NOTE — ED Triage Notes (Signed)
 Patient states that his girlfriend wanted him to get tested for STDs.  Patient states that she has not tested positive for any STDs and he is not having any symptoms.

## 2024-05-14 LAB — CYTOLOGY, (ORAL, ANAL, URETHRAL) ANCILLARY ONLY
Chlamydia: NEGATIVE
Comment: NEGATIVE
Comment: NEGATIVE
Comment: NORMAL
Neisseria Gonorrhea: NEGATIVE
Trichomonas: NEGATIVE

## 2024-05-20 ENCOUNTER — Emergency Department
Admission: EM | Admit: 2024-05-20 | Discharge: 2024-05-20 | Disposition: A | Discharge status: Home / Self Care | Attending: Emergency Medicine | Admitting: Emergency Medicine

## 2024-05-20 ENCOUNTER — Other Ambulatory Visit: Payer: Self-pay

## 2024-05-20 DIAGNOSIS — R059 Cough, unspecified: Secondary | ICD-10-CM | POA: Diagnosis present

## 2024-05-20 DIAGNOSIS — J069 Acute upper respiratory infection, unspecified: Secondary | ICD-10-CM | POA: Diagnosis not present

## 2024-05-20 LAB — GROUP A STREP BY PCR: Group A Strep by PCR: NOT DETECTED

## 2024-05-20 LAB — RESP PANEL BY RT-PCR (RSV, FLU A&B, COVID)  RVPGX2
Influenza A by PCR: NEGATIVE
Influenza B by PCR: NEGATIVE
Resp Syncytial Virus by PCR: NEGATIVE
SARS Coronavirus 2 by RT PCR: NEGATIVE
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
## 2024-05-20 NOTE — ED Notes (Signed)
 See triage note  Presents with a 3 days hx of sore throat  Unsure of fever Afebrile on arrival Also has had non prod cough

## 2024-05-20 NOTE — ED Provider Notes (Signed)
 [facility] Provider Note   CSN: [id number] Arrival date & time: 05/20/24  5643     History  Chief Complaint  Patient presents with   Sore Throat   Cough    Joe Nichols is a 28 y.o. male.  Patient here for sore throat flulike symptoms.  3 days of cough, sore throat, congestion.  Does have ill contacts family members with other viral URI.  No abdominal pain nausea vomiting diarrhea.  Has not taken any medication for this today.   Sore Throat Pertinent negatives include no chest pain and no headaches.  Cough Associated symptoms: sore throat   Associated symptoms: no chest pain, no chills, no fever and no headaches        Home Medications Prior to Admission medications   Not on File      Allergies    Patient has no known allergies.    Review of Systems   Review of Systems  Constitutional:  Negative for chills and fever.  HENT:  Positive for sore throat. Negative for congestion.   Respiratory:  Positive for cough.   Cardiovascular:  Negative for chest pain.  Gastrointestinal:  Negative for abdominal distention.  Musculoskeletal:  Negative for arthralgias.  Skin:  Negative for wound.  Neurological:  Negative for weakness, numbness and headaches.    Physical Exam Updated Vital Signs BP 113/79   Pulse 72   Temp 98 F (36.7 C)   Resp 17   Ht 5\' 11"  (1.803 m)   Wt 72.6 kg   SpO2 100%   BMI 22.32 kg/m  Physical Exam Vitals reviewed.  Constitutional:      General: He is not in acute distress.    Appearance: Normal appearance. He is not toxic-appearing.  HENT:     Head: Normocephalic and atraumatic.     Nose: Nose normal.     Mouth/Throat:     Mouth: Mucous membranes are moist. Mucous membranes are pale.     Comments: Oropharynx is clear.  Mild tonsillar edema left greater than right.  No obstruction.  No exudate.  No submandibular swelling. Cardiovascular:     Rate and Rhythm: Normal rate.     Pulses: Normal  pulses.  Pulmonary:     Effort: Pulmonary effort is normal. No respiratory distress.     Breath sounds: No wheezing or rales.  Musculoskeletal:     Cervical back: Normal range of motion.  Neurological:     Mental Status: He is alert and oriented to person, place, and time. Mental status is at baseline.  Psychiatric:        Mood and Affect: Mood normal.        Behavior: Behavior normal.     ED Results / Procedures / Treatments   Labs (all labs ordered are listed, but only abnormal results are displayed) Labs Reviewed  GROUP A STREP BY PCR  RESP PANEL BY RT-PCR (RSV, FLU A&B, COVID)  [id number]    EKG None  Radiology No results found.  Procedures Procedures    Medications Ordered in ED Medications - No data to display  ED Course/ Medical Decision Making/ A&P                                 Medical Decision Making Patient here for flulike symptoms with sore throat cough congestion for about 3 days now.  He is hemodynamically stable afebrile nonhypoxic  overall well-appearing no acute distress.  Does have mild tonsillar edema no obstructive symptoms. Test negative for strep throat.           Final Clinical Impression(s) / ED Diagnoses Final diagnoses:  Upper respiratory tract infection, unspecified type    Rx / DC Orders ED Discharge Orders     None         Hollie Luria, PA-C 05/20/24 5621    Viviano Ground, MD 05/20/24 [phone]

## 2024-05-20 NOTE — Discharge Instructions (Signed)
 Please take Tylenol 650 mg 4 times daily and/or ibuprofen 600 mg 3 times daily with food.  This will help with pain and fever. Ensure you get plenty of fluids. Use Mucinex for any sinus congestion.  Follow-up with primary care for further evaluation in the next few days.  Return here for new or worse symptoms including difficulty breathing, severe pain, intractable vomiting

## 2024-05-30 DIAGNOSIS — Z419 Encounter for procedure for purposes other than remedying health state, unspecified: Secondary | ICD-10-CM | POA: Diagnosis not present

## 2024-06-29 DIAGNOSIS — Z419 Encounter for procedure for purposes other than remedying health state, unspecified: Secondary | ICD-10-CM | POA: Diagnosis not present

## 2024-07-30 DIAGNOSIS — Z419 Encounter for procedure for purposes other than remedying health state, unspecified: Secondary | ICD-10-CM | POA: Diagnosis not present

## 2024-08-24 ENCOUNTER — Emergency Department
Admission: EM | Admit: 2024-08-24 | Discharge: 2024-08-24 | Disposition: A | Discharge status: Home / Self Care | Attending: Emergency Medicine | Admitting: Emergency Medicine

## 2024-08-24 ENCOUNTER — Emergency Department

## 2024-08-24 ENCOUNTER — Other Ambulatory Visit: Payer: Self-pay

## 2024-08-24 DIAGNOSIS — M545 Low back pain, unspecified: Secondary | ICD-10-CM | POA: Diagnosis not present

## 2024-08-24 DIAGNOSIS — Y92410 Unspecified street and highway as the place of occurrence of the external cause: Secondary | ICD-10-CM | POA: Diagnosis not present
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
## 2024-08-24 NOTE — Discharge Instructions (Signed)
 You were seen in the emergency department today for evaluation of your back pain. Fortunately, your exam and xray here were reassuring. You can take Tylenol and Ibuprofen  to help with your pain, unless there is another reason you should not take these. You can also try lidocaine  patches that are available over-the-counter.  Return to the ER for new or worsening symptoms.
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
## 2024-08-30 DIAGNOSIS — Z419 Encounter for procedure for purposes other than remedying health state, unspecified: Secondary | ICD-10-CM | POA: Diagnosis not present
